# Patient Record
Sex: Female | Born: 1995 | Race: White | Marital: Single
Health system: Southern US, Community
[De-identification: ages and names within clinical notes are randomized; demographics above are authoritative.]

---

## 2017-03-27 ENCOUNTER — Encounter: Payer: Self-pay | Admitting: Emergency Medicine

## 2017-03-27 ENCOUNTER — Emergency Department: Payer: BLUE CROSS/BLUE SHIELD

## 2017-03-27 ENCOUNTER — Emergency Department
Admission: EM | Admit: 2017-03-27 | Discharge: 2017-03-27 | Disposition: A | Discharge status: Home / Self Care | Payer: BLUE CROSS/BLUE SHIELD | Attending: Emergency Medicine | Admitting: Emergency Medicine

## 2017-03-27 DIAGNOSIS — Y999 Unspecified external cause status: Secondary | ICD-10-CM | POA: Diagnosis not present

## 2017-03-27 DIAGNOSIS — W06XXXA Fall from bed, initial encounter: Secondary | ICD-10-CM | POA: Diagnosis not present

## 2017-03-27 DIAGNOSIS — Y92214 College as the place of occurrence of the external cause: Secondary | ICD-10-CM | POA: Insufficient documentation

## 2017-03-27 DIAGNOSIS — Y939 Activity, unspecified: Secondary | ICD-10-CM | POA: Insufficient documentation

## 2017-03-27 DIAGNOSIS — S300XXA Contusion of lower back and pelvis, initial encounter: Secondary | ICD-10-CM | POA: Insufficient documentation

## 2017-03-27 DIAGNOSIS — S3992XA Unspecified injury of lower back, initial encounter: Secondary | ICD-10-CM | POA: Diagnosis present

## 2017-03-27 LAB — POCT PREGNANCY, URINE: Preg Test, Ur: NEGATIVE

## 2017-03-27 MED ORDER — NAPROXEN 500 MG PO TABS: 500.0000 mg | ORAL_TABLET | Freq: Two times a day (BID) | ORAL | 0 refills | Status: AC

## 2017-03-27 MED ORDER — TRAMADOL HCL 50 MG PO TABS: 50.0000 mg | ORAL_TABLET | Freq: Four times a day (QID) | ORAL | 0 refills | Status: AC | PRN

## 2017-03-27 NOTE — ED Provider Notes (Signed)
[facility] Emergency Department Provider Note   ____________________________________________   First MD Initiated Contact with Patient 03/27/17 1452     (approximate)  I have reviewed the triage vital signs and the nursing notes.   HISTORY  Chief Complaint Tailbone Pain    HPI Katherine Choi is a 22 y.o. female patient complaint of coccyx pain secondary to fall from bed last night.  Patient denies radicular component to her back pain.  Patient denies bladder bowel dysfunction.  Patient is able to ambulate with discomfort.  Patient state pain increased with sitting on her coccyx.  Patient rates the pain as 6/10.  Patient described the pain is "aching".  No palliative measure for complaint. History reviewed. No pertinent past medical history.  There are no active problems to display for this patient.     Prior to Admission medications   Medication Sig Start Date End Date Taking? Authorizing Provider  naproxen (NAPROSYN) 500 MG tablet Take 1 tablet (500 mg total) by mouth 2 (two) times daily with a meal. 03/27/17   Joni ReiningSmith, Isabelle Matt K, PA-C  traMADol (ULTRAM) 50 MG tablet Take 1 tablet (50 mg total) by mouth every 6 (six) hours as needed for moderate pain. 03/27/17   Joni ReiningSmith, Sandor Arboleda K, PA-C    Allergies Patient has no known allergies.  No family history on file.  Social History Social History   Tobacco Use  . Smoking status: Not on file  Substance Use Topics  . Alcohol use: Not on file  . Drug use: Not on file    Review of Systems Constitutional: No fever/chills Eyes: No visual changes. ENT: No sore throat. Cardiovascular: Denies chest pain. Respiratory: Denies shortness of breath. Gastrointestinal: No abdominal pain.  No nausea, no vomiting.  No diarrhea.  No constipation. Genitourinary: Negative for dysuria. Musculoskeletal: Tailbone pain Skin: Negative for rash. Neurological: Negative for headaches, focal weakness or  numbness.   ____________________________________________   PHYSICAL EXAM:  VITAL SIGNS: ED Triage Vitals  Enc Vitals Group     BP 03/27/17 1350 134/90     Pulse Rate 03/27/17 1350 93     Resp 03/27/17 1350 18     Temp 03/27/17 1350 97.8 F (36.6 C)     Temp Source 03/27/17 1350 Oral     SpO2 03/27/17 1350 100 %     Weight 03/27/17 1352 115 lb (52.2 kg)     Height 03/27/17 1352 5\' 4"  (1.626 m)     Head Circumference --      Peak Flow --      Pain Score 03/27/17 1352 6     Pain Loc --      Pain Edu? --      Excl. in GC? --    Constitutional: Alert and oriented. Well appearing and in no acute distress. Cardiovascular: Normal rate, regular rhythm. Grossly normal heart sounds.  Good peripheral circulation. Respiratory: Normal respiratory effort.  No retractions. Lungs CTAB. Musculoskeletal: no spinal deformity.  Patient is moderate guarding palpation L4 through S1. Neurologic:  Normal speech and language. No gross focal neurologic deficits are appreciated. No gait instability. Skin:  Skin is warm, dry and intact. No rash noted. Psychiatric: Mood and affect are normal. Speech and behavior are normal.  ____________________________________________   LABS (all labs ordered are listed, but only abnormal results are displayed)  Labs Reviewed  POC URINE PREG, ED  POCT PREGNANCY, URINE   ____________________________________________  EKG   ____________________________________________  RADIOLOGY  ED MD interpretation: No  acute findings on x-ray of the coccyx.  Official radiology report(s): Dg Sacrum/coccyx  Result Date: 03/27/2017 CLINICAL DATA:  Larey Seat out of bed last night, tailbone pain EXAM: SACRUM AND COCCYX - 2+ VIEW COMPARISON:  None FINDINGS: Osseous mineralization normal. Hip and SI joint spaces preserved. Sacral foramina grossly symmetric. No definite sacrococcygeal fracture identified. IMPRESSION: No acute abnormalities. Electronically Signed   By: Ulyses Southward M.D.    On: 03/27/2017 16:13    ____________________________________________   PROCEDURES  Procedure(s) performed: None  Procedures  Critical Care performed: No  ____________________________________________   INITIAL IMPRESSION / ASSESSMENT AND PLAN / ED COURSE  As part of my medical decision making, I reviewed the following data within the electronic MEDICAL RECORD NUMBER    Toxic contusion secondary to fall.  Discussed negative x-ray findings with patient.  Patient given discharge care instruction.  Patient advised take medication as directed and purchase a doughnut pillow for comfort.  Patient [facility]      ____________________________________________   FINAL CLINICAL IMPRESSION(S) / ED DIAGNOSES  Final diagnoses:  Coccyx contusion, initial encounter     ED Discharge Orders        Ordered    naproxen (NAPROSYN) 500 MG tablet  2 times daily with meals     03/27/17 1628    traMADol (ULTRAM) 50 MG tablet  Every 6 hours PRN     03/27/17 1628       Note:  This document was prepared using Dragon voice recognition software and may include unintentional dictation errors.    Joni Reining, PA-C 03/27/17 1632    Phineas Semen, MD 03/27/17 [phone]

## 2017-03-27 NOTE — ED Triage Notes (Signed)
Pt reports falling off of her [employer] bed (about 3-714ft tall) last night and landing on her butt. Reports continued pain. Pt is ambulatory but painful to walk.

## 2018-08-26 IMAGING — CR DG SACRUM/COCCYX 2+V
1 series · 3 of 3 positions shown · non-contrast
Comparison: None

CLINICAL DATA: Fell out of bed last night, tailbone pain

EXAM:
SACRUM AND COCCYX - 2+ VIEW

[Series 1: dg sacrum/coccyx · 0.14mm/px · 3 of 3 slices shown]
[im 1/3]
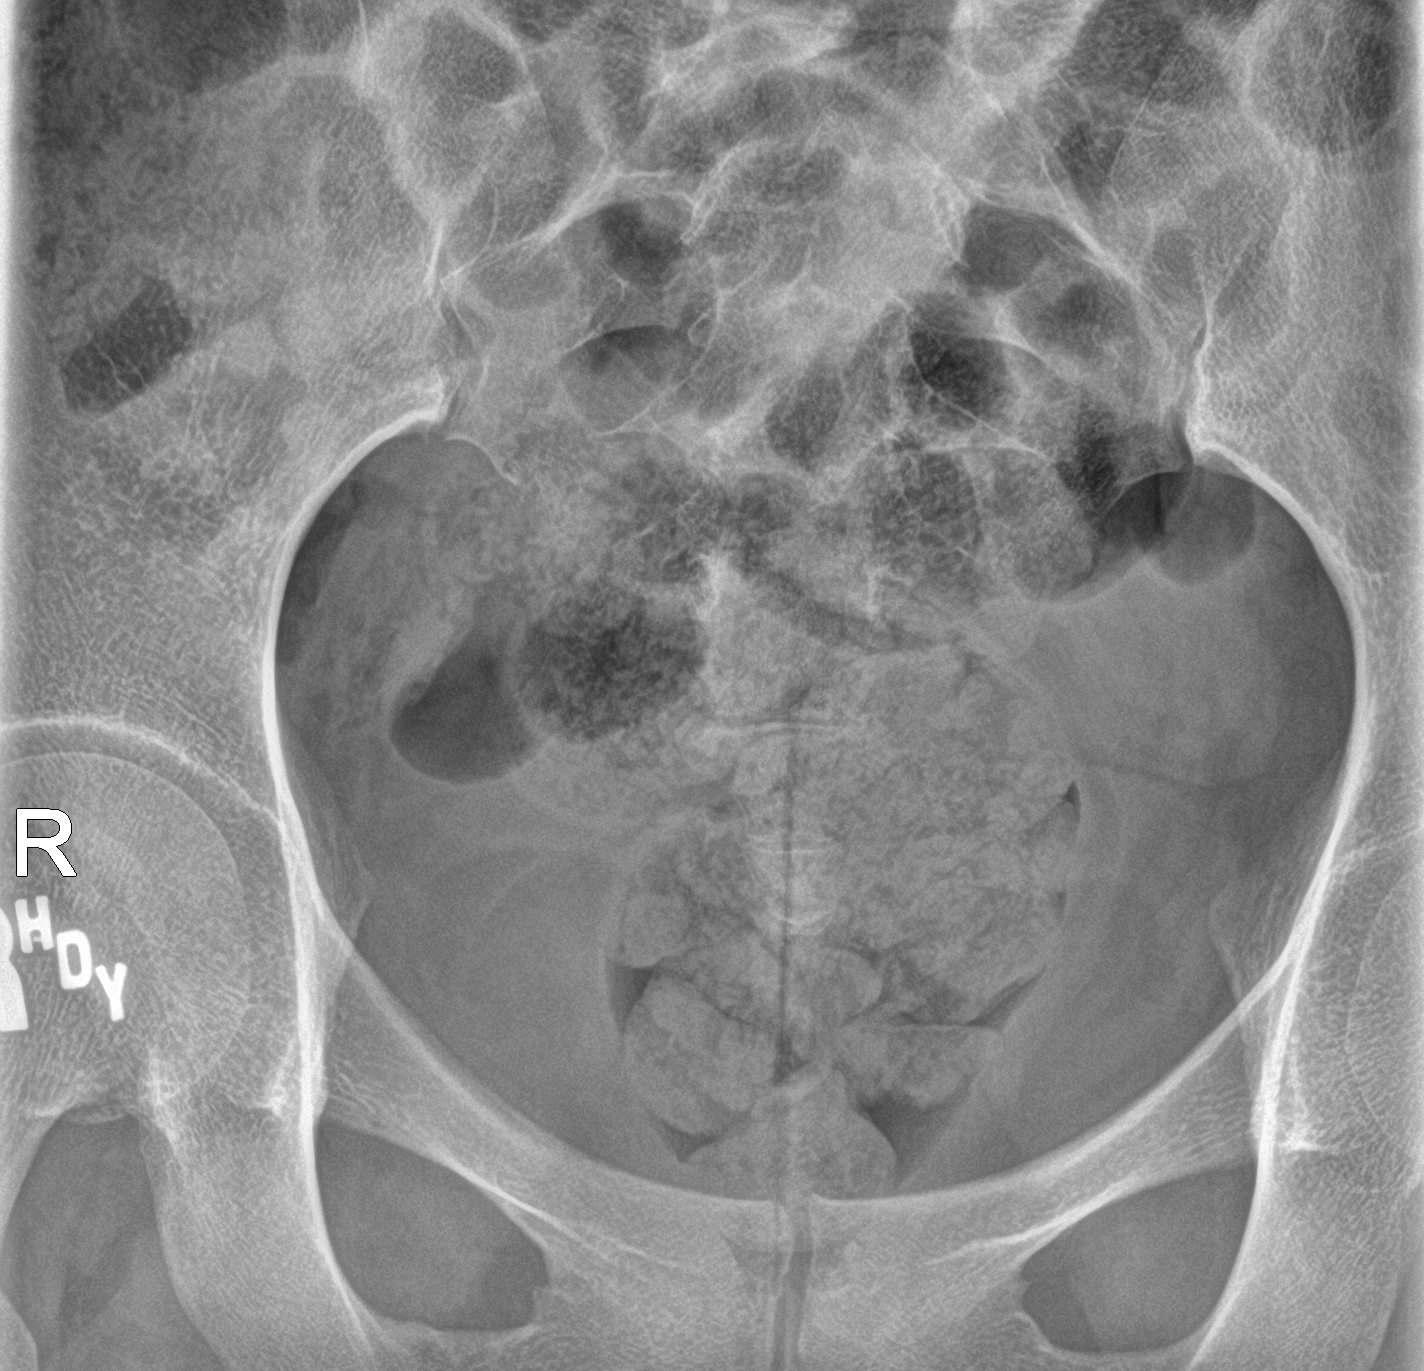
[im 2/3]
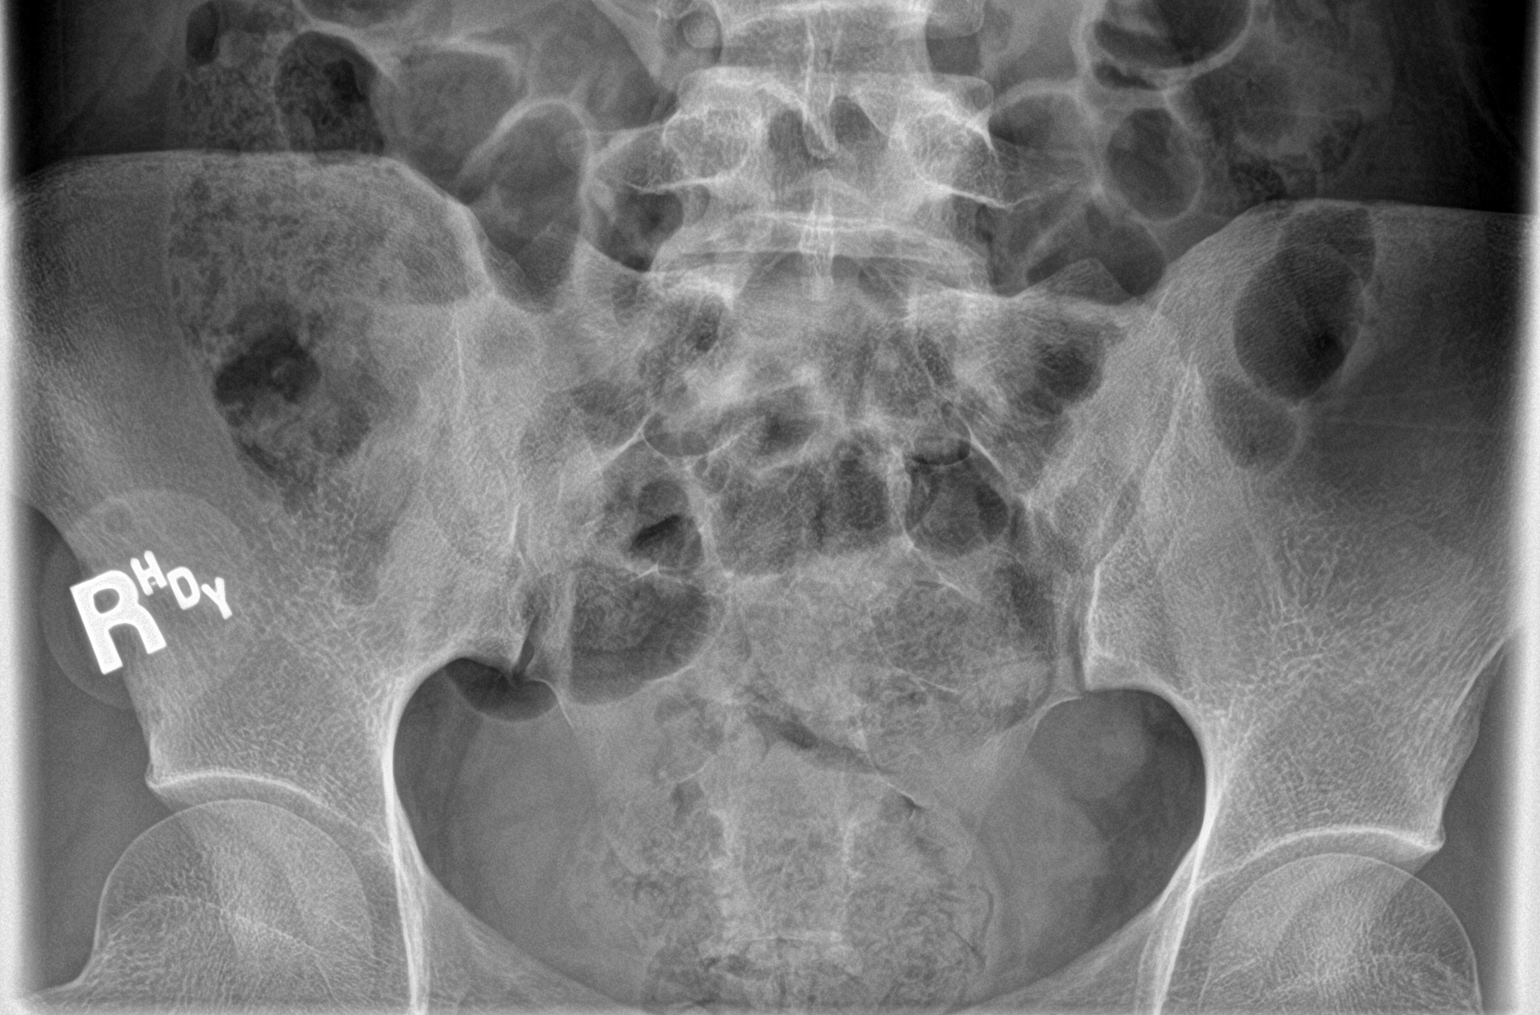
[im 3/3]
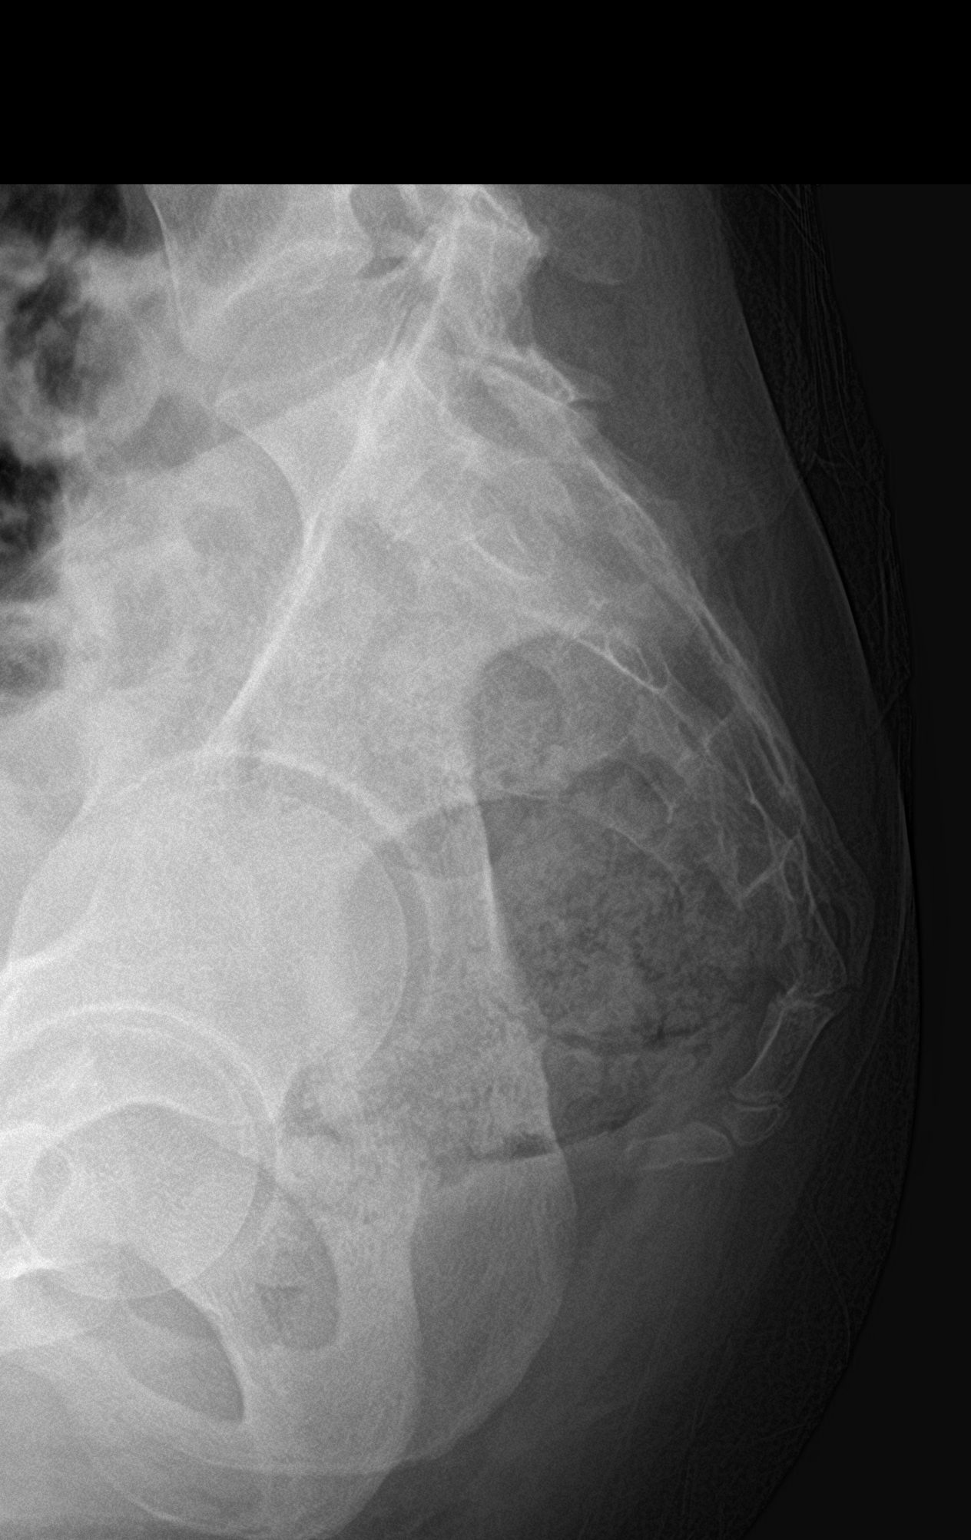

[3 of 3 positions shown; findings below may reference images not displayed]

FINDINGS: Osseous mineralization normal.

Hip and SI joint spaces preserved.

Sacral foramina grossly symmetric.

No definite sacrococcygeal fracture identified..
IMPRESSION: No acute abnormalities.

## 2018-11-06 ENCOUNTER — Other Ambulatory Visit: Payer: Self-pay

## 2018-11-06 DIAGNOSIS — Z20822 Contact with and (suspected) exposure to covid-19: Secondary | ICD-10-CM

## 2018-11-07 LAB — NOVEL CORONAVIRUS, NAA: SARS-CoV-2, NAA: NOT DETECTED
